# Patient Record
Sex: Male | Born: 1985 | Race: Black or African American | Hispanic: No | Marital: Single | Smoking: Never smoker
Health system: Southern US, Community
[De-identification: ages and names within clinical notes are randomized; demographics above are authoritative.]

---

## 2019-08-26 ENCOUNTER — Emergency Department
Admission: EM | Admit: 2019-08-26 | Discharge: 2019-08-26 | Disposition: A | Discharge status: Home / Self Care | Payer: BC Managed Care – PPO | Attending: Emergency Medicine | Admitting: Emergency Medicine

## 2019-08-26 ENCOUNTER — Emergency Department: Payer: BC Managed Care – PPO

## 2019-08-26 ENCOUNTER — Other Ambulatory Visit: Payer: Self-pay

## 2019-08-26 ENCOUNTER — Encounter: Payer: Self-pay | Admitting: Emergency Medicine

## 2019-08-26 DIAGNOSIS — R05 Cough: Secondary | ICD-10-CM | POA: Diagnosis present

## 2019-08-26 DIAGNOSIS — U071 COVID-19: Secondary | ICD-10-CM | POA: Insufficient documentation

## 2019-08-26 LAB — CBC WITH DIFFERENTIAL/PLATELET
Abs Immature Granulocytes: 0.01 10*3/uL (ref 0.00–0.07)
Basophils Absolute: 0 10*3/uL (ref 0.0–0.1)
Basophils Relative: 0 %
Eosinophils Absolute: 0 10*3/uL (ref 0.0–0.5)
Eosinophils Relative: 0 %
HCT: 41.6 % (ref 39.0–52.0)
Hemoglobin: 13.9 g/dL (ref 13.0–17.0)
Immature Granulocytes: 0 %
Lymphocytes Relative: 23 %
Lymphs Abs: 0.7 10*3/uL (ref 0.7–4.0)
MCH: 28.5 pg (ref 26.0–34.0)
MCHC: 33.4 g/dL (ref 30.0–36.0)
MCV: 85.4 fL (ref 80.0–100.0)
Monocytes Absolute: 0.5 10*3/uL (ref 0.1–1.0)
Monocytes Relative: 17 %
Neutro Abs: 1.7 10*3/uL (ref 1.7–7.7)
Neutrophils Relative %: 60 %
Platelets: 200 10*3/uL (ref 150–400)
RBC: 4.87 MIL/uL (ref 4.22–5.81)
RDW: 11.9 % (ref 11.5–15.5)
Smear Review: NORMAL
WBC: 2.9 10*3/uL — ABNORMAL LOW (ref 4.0–10.5)
nRBC: 0 % (ref 0.0–0.2)

## 2019-08-26 LAB — COMPREHENSIVE METABOLIC PANEL
ALT: 31 U/L (ref 0–44)
AST: 28 U/L (ref 15–41)
Albumin: 3.8 g/dL (ref 3.5–5.0)
Alkaline Phosphatase: 44 U/L (ref 38–126)
Anion gap: 10 (ref 5–15)
BUN: 10 mg/dL (ref 6–20)
CO2: 25 mmol/L (ref 22–32)
Calcium: 8.8 mg/dL — ABNORMAL LOW (ref 8.9–10.3)
Chloride: 99 mmol/L (ref 98–111)
Creatinine, Ser: 1.03 mg/dL (ref 0.61–1.24)
GFR calc Af Amer: >60 mL/min (ref >60)
GFR calc non Af Amer: >60 mL/min (ref >60)
Glucose, Bld: 96 mg/dL (ref 70–99)
Potassium: 3.9 mmol/L (ref 3.5–5.1)
Sodium: 134 mmol/L — ABNORMAL LOW (ref 135–145)
Total Bilirubin: 0.7 mg/dL (ref 0.3–1.2)
Total Protein: 7.3 g/dL (ref 6.5–8.1)

## 2019-08-26 LAB — FIBRIN DERIVATIVES D-DIMER (ARMC ONLY): Fibrin derivatives D-dimer (ARMC): 566.11 ng/mL (FEU) — ABNORMAL HIGH (ref 0.00–499.00)

## 2019-08-26 LAB — TROPONIN I (HIGH SENSITIVITY): Troponin I (High Sensitivity): 5 ng/L (ref <18)

## 2019-08-26 MED ORDER — AZITHROMYCIN 500 MG PO TABS
500.0000 mg | ORAL_TABLET | Freq: Once | ORAL | Status: AC
  Administered 2019-08-26: 500 mg via ORAL
  Filled 2019-08-26: qty 1

## 2019-08-26 MED ORDER — DEXAMETHASONE SODIUM PHOSPHATE 10 MG/ML IJ SOLN
8.0000 mg | Freq: Once | INTRAMUSCULAR | Status: AC
  Administered 2019-08-26: 8 mg via INTRAVENOUS

## 2019-08-26 MED ORDER — IOHEXOL 350 MG/ML SOLN
75.0000 mL | Freq: Once | INTRAVENOUS | Status: AC | PRN
  Administered 2019-08-26: 75 mL via INTRAVENOUS
  Filled 2019-08-26: qty 75

## 2019-08-26 MED ORDER — AZITHROMYCIN 250 MG PO TABS: ORAL_TABLET | ORAL | 0 refills | Status: AC

## 2019-08-26 MED ORDER — BENZONATATE 100 MG PO CAPS: 100.0000 mg | ORAL_CAPSULE | Freq: Three times a day (TID) | ORAL | 0 refills | Status: AC | PRN

## 2019-08-26 MED ORDER — ACETAMINOPHEN 500 MG PO TABS
1000.0000 mg | ORAL_TABLET | Freq: Once | ORAL | Status: AC
  Administered 2019-08-26: 1000 mg via ORAL
  Filled 2019-08-26: qty 2

## 2019-08-26 MED ORDER — ALBUTEROL SULFATE HFA 108 (90 BASE) MCG/ACT IN AERS
2.0000 | INHALATION_SPRAY | Freq: Once | RESPIRATORY_TRACT | Status: AC
  Administered 2019-08-26: 2 via RESPIRATORY_TRACT
  Filled 2019-08-26: qty 6.7

## 2019-08-26 MED ORDER — IBUPROFEN 600 MG PO TABS
600.0000 mg | ORAL_TABLET | Freq: Once | ORAL | Status: AC
  Administered 2019-08-26: 600 mg via ORAL
  Filled 2019-08-26: qty 1

## 2019-08-26 MED ORDER — DEXAMETHASONE SODIUM PHOSPHATE 10 MG/ML IJ SOLN
INTRAMUSCULAR | Status: AC
  Filled 2019-08-26: qty 1

## 2019-08-26 NOTE — ED Triage Notes (Signed)
Pt reports tested positive for COVID on Friday at [facility] and is coughing hard and feels sob. No obvious distress noted, pt talking in complete sentences.

## 2019-08-26 NOTE — Discharge Instructions (Addendum)
There was no blood clot on your CT scan.  Your lab work was overall reassuring.  Please begin antibiotics tomorrow.  You can use albuterol inhaler every 4-6 hours as needed for shortness of breath.  Please continue alternating Tylenol and Motrin for fever.  Please return to the emergency department for any worsening of symptoms.

## 2019-08-26 NOTE — ED Notes (Signed)
Pt ambulated; states he feels "a little dizzy" walking, but not much different than when he is at rest.

## 2019-08-26 NOTE — ED Provider Notes (Signed)
[facility] Emergency Department Provider Note  ____________________________________________  Time seen: Approximately 11:11 AM  I have reviewed the triage vital signs and the nursing notes.   HISTORY  Chief Complaint Cough and Shortness of Breath    HPI Leonard Vance is a 34 y.o. male that is Covid positive that presents to the emergency department for evaluation of fever, weakness, dizziness, nonproductive cough, shortness of breath for 6 days.  He was coughing so hard that he did cough up a streak of blood.  Patient tested positive for Cov[facility] on Friday at [facility]  No sick contacts.  Patient quit smoking a couple of months ago. He has a history of bronchitis.  No vomiting, abdominal pain, diarrhea.   History reviewed. No pertinent past medical history.  There are no problems to display for this patient.   History reviewed. No pertinent surgical history.  Prior to Admission medications   Medication Sig Start Date End Date Taking? Authorizing Provider  azithromycin (ZITHROMAX Z-PAK) 250 MG tablet Take 2 tablets (500 mg) on  Day 1,  followed by 1 tablet (250 mg) once daily on Days 2 through 5. 08/26/19   Enid Derry, PA-C    Allergies Patient has no allergy information on record.  No family history on file.  Social History Social History   Tobacco Use  . Smoking status: Not on file  Substance Use Topics  . Alcohol use: Not on file  . Drug use: Not on file     Review of Systems  Constitutional: Positive for fever. Eyes: No visual changes. No discharge. ENT: Positive for congestion and rhinorrhea. Cardiovascular: No chest pain. Respiratory: Positive for cough.  Positive for SOB. Gastrointestinal: No abdominal pain.  No nausea, no vomiting.  No diarrhea.  No constipation. Musculoskeletal: Positive for body aches. Skin: Negative for rash, abrasions, lacerations, ecchymosis. Neurological: Negative for  headaches.   ____________________________________________   PHYSICAL EXAM:  VITAL SIGNS: ED Triage Vitals  Enc Vitals Group     BP 08/26/19 0924 (!) 141/83     Pulse Rate 08/26/19 0924 (!) 107     Resp 08/26/19 0924 20     Temp 08/26/19 0924 (!) 101.3 F (38.5 C)     Temp Source 08/26/19 0924 Oral     SpO2 08/26/19 0924 98 %     Weight 08/26/19 0922 240 lb (108.9 kg)     Height 08/26/19 0922 6' (1.829 m)     Head Circumference --      Peak Flow --      Pain Score --      Pain Loc --      Pain Edu? --      Excl. in GC? --      Constitutional: Alert and oriented. Well appearing and in no acute distress. Eyes: Conjunctivae are normal. PERRL. EOMI. No discharge. Head: Atraumatic. ENT: No frontal and maxillary sinus tenderness.      Ears: Tympanic membranes pearly gray with good landmarks. No discharge.      Nose: Mild congestion/rhinnorhea.      Mouth/Throat: Mucous membranes are moist. Oropharynx non-erythematous. Tonsils not enlarged. No exudates. Uvula midline. Neck: No stridor.   Hematological/Lymphatic/Immunilogical: No cervical lymphadenopathy. Cardiovascular: Normal rate, regular rhythm.  Good peripheral circulation. Respiratory: Normal respiratory effort without tachypnea or retractions. Lungs CTAB. Good air entry to the bases with no decreased or absent breath sounds. Gastrointestinal: Bowel sounds 4 quadrants. Soft and nontender to palpation. No guarding or rigidity. No palpable masses.  No distention. Musculoskeletal: Full range of motion to all extremities. No gross deformities appreciated. Neurologic:  Normal speech and language. No gross focal neurologic deficits are appreciated.  Skin:  Skin is warm, dry and intact. No rash noted. Psychiatric: Mood and affect are normal. Speech and behavior are normal. Patient exhibits appropriate insight and judgement.   ____________________________________________   LABS (all labs ordered are listed, but only abnormal  results are displayed)  Labs Reviewed  CBC WITH DIFFERENTIAL/PLATELET - Abnormal; Notable for the following components:      Result Value   WBC 2.9 (*)    All other components within normal limits  COMPREHENSIVE METABOLIC PANEL - Abnormal; Notable for the following components:   Sodium 134 (*)    Calcium 8.8 (*)    All other components within normal limits  FIBRIN DERIVATIVES D-DIMER ([facility] ONLY) - Abnormal; Notable for the following components:   Fibrin derivatives D-dimer ([facility]) 566.11 (*)    All other components within normal limits  TROPONIN I (HIGH SENSITIVITY)   ____________________________________________  EKG  SR ____________________________________________  RADIOLOGY Lexine Baton, personally viewed and evaluated these images (plain radiographs) as part of my medical decision making, as well as reviewing the written report by the radiologist.  DG Chest 1 View  Result Date: 08/26/2019 CLINICAL DATA:  COVID positive.  Coughing and short of breath. EXAM: CHEST  1 VIEW COMPARISON:  None. FINDINGS: 0959 hours. Low lung volumes. Cardiopericardial silhouette is at upper limits of normal for size. Vascular crowding/congestion. Subtle patchy opacity noted left base. The visualized bony structures of the thorax are intact. IMPRESSION: Low volume film crowds pulmonary vasculature and accentuates the cardiopericardial silhouette. Probably some patchy airspace disease at the left base. Recommend dedicated PA and lateral upright films to better evaluate as clinically warranted. Electronically Signed   By: Kennith Center M.D.   On: 08/26/2019 10:09   CT Angio Chest PE W and/or Wo Contrast  Result Date: 08/26/2019 CLINICAL DATA:  Shortness of breath.  COVID-19 positive. EXAM: CT ANGIOGRAPHY CHEST WITH CONTRAST TECHNIQUE: Multidetector CT imaging of the chest was performed using the standard protocol during bolus administration of intravenous contrast. Multiplanar CT image reconstructions  and MIPs were obtained to evaluate the vascular anatomy. CONTRAST:  62mL OMNIPAQUE IOHEXOL 350 MG/ML SOLN COMPARISON:  Chest radiograph August 26, 2019 FINDINGS: Cardiovascular: There is no demonstrable pulmonary embolus. There is no thoracic aortic aneurysm or dissection. Visualized great vessels appear normal. Right innominate and left common carotid arteries arise as a common trunk, an anatomic variant. There is no evident pericardial effusion or pericardial thickening. Mediastinum/Nodes: Visualized thyroid appears normal. There are scattered subcentimeter axillary lymph nodes. There is a lymph node at the aortopulmonary window level measuring 1.5 x 1.1 cm. There are subcarinal lymph nodes, largest measuring 1.1 x 1.0 cm. There is a right hilar lymph node measuring 1.3 x 1.3 cm. There is a left hilar lymph node 1.1 x 1.1 cm. No esophageal lesions are evident. Lungs/Pleura: There is extensive ground-glass type opacity scattered throughout the lungs bilaterally with involvement of nearly all lobes in segments to varying degrees with ground-glass type appearance consistent with widespread multifocal atypical organism pneumonia. No frank consolidation evident. No pleural effusions. Upper Abdomen: Visualized upper abdominal structures appear unremarkable. Musculoskeletal: No blastic or lytic bone lesions. There are no evident chest wall lesions. Review of the MIP images confirms the above findings. IMPRESSION: 1. No demonstrable pulmonary embolus. No thoracic aortic aneurysm or dissection. 2. Widespread ground-glass type opacities throughout  the lungs involving most lobes in segments. Suspect widespread multifocal atypical organism pneumonia. 3. Several mildly enlarged lymph nodes which may well be of reactive etiology given the extensive pneumonitis in the lungs. Electronically Signed   By: Lowella Grip III M.D.   On: 08/26/2019 15:28     ____________________________________________    PROCEDURES  Procedure(s) performed:    Procedures    Medications  acetaminophen (TYLENOL) tablet 1,000 mg (1,000 mg Oral Given 08/26/19 0953)  albuterol (VENTOLIN HFA) 108 (90 Base) MCG/ACT inhaler 2 puff (2 puffs Inhalation Given 08/26/19 1157)  azithromycin (ZITHROMAX) tablet 500 mg (500 mg Oral Given 08/26/19 1139)  dexamethasone (DECADRON) injection 8 mg (8 mg Intravenous Given 08/26/19 1158)  ibuprofen (ADVIL) tablet 600 mg (600 mg Oral Given 08/26/19 1454)  iohexol (OMNIPAQUE) 350 MG/ML injection 75 mL (75 mLs Intravenous Contrast Given 08/26/19 1512)     ____________________________________________   INITIAL IMPRESSION / ASSESSMENT AND PLAN / ED COURSE  Pertinent labs & imaging results that were available during my care of the patient were reviewed by me and considered in my medical decision making (see chart for details).  Review of the [place] CSRS was performed in accordance of the [place] prior to dispensing any controlled drugs.   Patient's diagnosis is consistent with covid 19. Patient is Covid positive.   Vital signs and exam are reassuring.  Patient was febrile and tachycardic on arrival to the emergency department.  Temperature 98.3 and HR 90 following antipyretics.  Probable infiltrate on chest x-ray.  No PE on CT scan.  CT scan consistent with COVID-19 showing widespread groundglass opacities. SR on EKG.  Lab work remarkable for WBC 2.9, consistent with viral infection.  CMP remarkable for sodium 134 and calcium 8.8.  Troponin within normal limits.  No oxygen desaturation with ambulation.  Patient was given IM Decadron and albuterol inhaler for shortness of breath and inflammation.  He will be started on azithromycin. Patient appears well and is staying well hydrated. Patient should alternate tylenol and ibuprofen for fever. Patient feels comfortable going home. Patient will be discharged home with prescriptions for  azithromycin. Patient is to follow up with primary care as needed or otherwise directed. Patient is given ED precautions to return to the ED for any worsening or new symptoms.  Leonard Vance was evaluated in Emergency Department on 08/26/2019 for the symptoms described in the history of present illness. He was evaluated in the context of the global COVID-19 pandemic, which necessitated consideration that the patient might be at risk for infection with the SARS-CoV-2 virus that causes COVID-19. Institutional protocols and algorithms that pertain to the evaluation of patients at risk for COVID-19 are in a state of rapid change based on information released by regulatory bodies including the CDC and federal and state organizations. These policies and algorithms were followed during the patient's care in the ED.   ____________________________________________  FINAL CLINICAL IMPRESSION(S) / ED DIAGNOSES  Final diagnoses:  COVID-19      NEW MEDICATIONS STARTED DURING THIS VISIT:  ED Discharge Orders         Ordered    azithromycin (ZITHROMAX Z-PAK) 250 MG tablet     08/26/19 1551              This chart was dictated using voice recognition software/Dragon. Despite best efforts to proofread, errors can occur which can change the meaning. Any change was purely unintentional.    Laban Emperor, PA-C 08/26/19 1626    Blake Divine, MD  08/31/19 1621  

## 2019-08-26 NOTE — ED Notes (Signed)
See triage note  Presents with fever and cough   States he was tested pos for COVID last Friday  States he conts to have cough and body aches

## 2019-08-30 ENCOUNTER — Emergency Department: Payer: BC Managed Care – PPO

## 2019-08-30 ENCOUNTER — Other Ambulatory Visit: Payer: Self-pay

## 2019-08-30 ENCOUNTER — Emergency Department
Admission: EM | Admit: 2019-08-30 | Discharge: 2019-08-30 | Disposition: A | Discharge status: Home / Self Care | Payer: BC Managed Care – PPO | Attending: Emergency Medicine | Admitting: Emergency Medicine

## 2019-08-30 ENCOUNTER — Encounter: Payer: Self-pay | Admitting: Emergency Medicine

## 2019-08-30 DIAGNOSIS — U071 COVID-19: Secondary | ICD-10-CM | POA: Diagnosis not present

## 2019-08-30 DIAGNOSIS — R0602 Shortness of breath: Secondary | ICD-10-CM | POA: Diagnosis present

## 2019-08-30 DIAGNOSIS — J1282 Pneumonia due to coronavirus disease 2019: Secondary | ICD-10-CM | POA: Insufficient documentation

## 2019-08-30 LAB — CBC
HCT: 45.4 % (ref 39.0–52.0)
Hemoglobin: 15.4 g/dL (ref 13.0–17.0)
MCH: 28.9 pg (ref 26.0–34.0)
MCHC: 33.9 g/dL (ref 30.0–36.0)
MCV: 85.3 fL (ref 80.0–100.0)
Platelets: 334 10*3/uL (ref 150–400)
RBC: 5.32 MIL/uL (ref 4.22–5.81)
RDW: 11.8 % (ref 11.5–15.5)
WBC: 4.7 10*3/uL (ref 4.0–10.5)
nRBC: 0 % (ref 0.0–0.2)

## 2019-08-30 LAB — TROPONIN I (HIGH SENSITIVITY)
Troponin I (High Sensitivity): 3 ng/L (ref <18)
Troponin I (High Sensitivity): 4 ng/L (ref <18)

## 2019-08-30 LAB — BASIC METABOLIC PANEL
Anion gap: 12 (ref 5–15)
BUN: 11 mg/dL (ref 6–20)
CO2: 27 mmol/L (ref 22–32)
Calcium: 9.1 mg/dL (ref 8.9–10.3)
Chloride: 99 mmol/L (ref 98–111)
Creatinine, Ser: 0.98 mg/dL (ref 0.61–1.24)
GFR calc Af Amer: >60 mL/min (ref >60)
GFR calc non Af Amer: >60 mL/min (ref >60)
Glucose, Bld: 109 mg/dL — ABNORMAL HIGH (ref 70–99)
Potassium: 4 mmol/L (ref 3.5–5.1)
Sodium: 138 mmol/L (ref 135–145)

## 2019-08-30 LAB — FIBRIN DERIVATIVES D-DIMER (ARMC ONLY): Fibrin derivatives D-dimer (ARMC): 852.86 ng/mL (FEU) — ABNORMAL HIGH (ref 0.00–499.00)

## 2019-08-30 MED ORDER — DEXAMETHASONE SODIUM PHOSPHATE 10 MG/ML IJ SOLN
10.0000 mg | Freq: Once | INTRAMUSCULAR | Status: AC
  Administered 2019-08-30: 10 mg via INTRAMUSCULAR
  Filled 2019-08-30: qty 1

## 2019-08-30 MED ORDER — DEXAMETHASONE 0.75 MG PO TABS: 0.7500 mg | ORAL_TABLET | Freq: Two times a day (BID) | ORAL | 0 refills | Status: AC

## 2019-08-30 MED ORDER — SODIUM CHLORIDE 0.9% FLUSH: 3.0000 mL | Freq: Once | INTRAVENOUS | Status: DC

## 2019-08-30 NOTE — ED Notes (Signed)
Pt ambulated around room, pt O2 sat maintained 94-97% on RA

## 2019-08-30 NOTE — ED Notes (Signed)
See triage note  Presents with some SOB  Was seen last week for same sx's   States he is still running low grade fever and having cough  Ambulates well  Cont's to speak in full sentences

## 2019-08-30 NOTE — ED Triage Notes (Signed)
Pt presents to ED via POV, states tested positive on 3/12, seen on 3/18 for same. Pt states continues to feel SOB, continues to have cough. Pt ambulatory without difficulty and able to speak in full and complete sentences, RA sats 98% after ambulating to triage room.

## 2019-08-30 NOTE — Discharge Instructions (Signed)
Return to the emergency department if worsening.  Use medication as prescribed. You should remain out of work for an additional 5 days.

## 2019-08-30 NOTE — ED Provider Notes (Signed)
[facility] Emergency Department Provider Note  ____________________________________________   First MD Initiated Contact with Patient 08/30/19 1253     (approximate)  I have reviewed the triage vital signs and the nursing notes.   HISTORY  Chief Complaint Covid+ and Shortness of Breath    HPI Leonard Vance is a 34 y.o. male presents emergency department complaint of shortness of breath due to Covid.  Patient states he was diagnosed with Covid on 08/20/2019.  Was seen here last week because he coughed up some blood and was told to return if he was not improving.  Patient states he still coughing and feels short of breath.  He supposed to go back to work tomorrow.  He denies any other symptoms at this time.   He did take his Z-Pak and Tessalon Perles along with his inhaler.   History reviewed. No pertinent past medical history.  There are no problems to display for this patient.   History reviewed. No pertinent surgical history.  Prior to Admission medications   Medication Sig Start Date End Date Taking? Authorizing Provider  azithromycin (ZITHROMAX Z-PAK) 250 MG tablet Take 2 tablets (500 mg) on  Day 1,  followed by 1 tablet (250 mg) once daily on Days 2 through 5. 08/26/19   Enid Derry, PA-C  benzonatate (TESSALON PERLES) 100 MG capsule Take 1 capsule (100 mg total) by mouth 3 (three) times daily as needed. 08/26/19 08/25/20  Enid Derry, PA-C  dexamethasone (DECADRON) 0.75 MG tablet Take 1 tablet (0.75 mg total) by mouth 2 (two) times daily. 08/30/19   Faythe Ghee, PA-C    Allergies Patient has no known allergies.  History reviewed. No pertinent family history.  Social History Social History   Tobacco Use  . Smoking status: Not on file  Substance Use Topics  . Alcohol use: Not on file  . Drug use: Not on file    Review of Systems  Constitutional: No fever/chills Eyes: No visual changes. ENT: No sore throat. Respiratory:  Positive cough and shortness of breath Cardiovascular: Denies chest pain Gastrointestinal: Denies abdominal pain Genitourinary: Negative for dysuria. Musculoskeletal: Negative for back pain. Skin: Negative for rash. Psychiatric: no mood changes,     ____________________________________________   PHYSICAL EXAM:  VITAL SIGNS: ED Triage Vitals  Enc Vitals Group     BP 08/30/19 1118 (!) 112/98     Pulse Rate 08/30/19 1118 (!) 107     Resp 08/30/19 1118 (!) 22     Temp 08/30/19 1118 99.1 F (37.3 C)     Temp Source 08/30/19 1118 Oral     SpO2 08/30/19 1118 97 %     Weight 08/30/19 1126 240 lb (108.9 kg)     Height 08/30/19 1126 6' (1.829 m)     Head Circumference --      Peak Flow --      Pain Score 08/30/19 1119 0     Pain Loc --      Pain Edu? --      Excl. in GC? --     Constitutional: Alert and oriented. Well appearing and in no acute distress. Eyes: Conjunctivae are normal.  Head: Atraumatic. Nose: No congestion/rhinnorhea. Mouth/Throat: Mucous membranes are moist.   Neck:  supple no lymphadenopathy noted Cardiovascular: Normal rate, regular rhythm. Heart sounds are normal Respiratory: Normal respiratory effort.  No retractions, lungs c t a, cough is dry GU: deferred Musculoskeletal: FROM all extremities, warm and well perfused Neurologic:  Normal speech  and language.  Skin:  Skin is warm, dry and intact. No rash noted. Psychiatric: Mood and affect are normal. Speech and behavior are normal.  ____________________________________________   LABS (all labs ordered are listed, but only abnormal results are displayed)  Labs Reviewed  BASIC METABOLIC PANEL - Abnormal; Notable for the following components:      Result Value   Glucose, Bld 109 (*)    All other components within normal limits  FIBRIN DERIVATIVES D-DIMER ([facility] ONLY) - Abnormal; Notable for the following components:   Fibrin derivatives D-dimer ([facility]) 852.86 (*)    All other components within normal  limits  CBC  TROPONIN I (HIGH SENSITIVITY)  TROPONIN I (HIGH SENSITIVITY)   ____________________________________________   ____________________________________________  RADIOLOGY  Chest x-ray remains stable from last visit  ____________________________________________   PROCEDURES  Procedure(s) performed: EKG shows tachycardia at 110 bpm  Procedures    ____________________________________________   INITIAL IMPRESSION / ASSESSMENT AND PLAN / ED COURSE  Pertinent labs & imaging results that were available during my care of the patient were reviewed by me and considered in my medical decision making (see chart for details).   Patient is 34 year old Covid positive male presents emergency department complaining of cough and shortness of breath.  States he is not improved since his visit last week.  See HPI  Physical exam patient appears well.  He is tachycardic but his O2 saturation is 97% on room air.  Remainder of exam is unremarkable  DDx: Covid, Covid pneumonia, PE  CBC is normal, basic metabolic panel is normal, troponin is normal, D-dimer is pending  Chest x-ray appears stable from last visit 1 week ago.  Patient does appear very well and stable.  Explained to him that we will await the D-dimer results to see if he needs a CT.  If not he will be able to be discharged with prescription for dexamethasone.  He does not want to return to work tomorrow which I agree if he still symptomatic he could remain out.  D-dimer is elevated at 856.  However I do feel it is not high enough to indicate PE on a Covid patient.  He will be placed on dexamethasone.  He was written out of work for an additional week.  Strict instructions to return if worsening.  I did discuss treatment plan with Dr. Corky Downs.  He agrees with treatment plan.  Patient was discharged stable condition.      Leonard Vance was evaluated in Emergency Department on 08/30/2019 for the symptoms described in the  history of present illness. He was evaluated in the context of the global COVID-19 pandemic, which necessitated consideration that the patient might be at risk for infection with the SARS-CoV-2 virus that causes COVID-19. Institutional protocols and algorithms that pertain to the evaluation of patients at risk for COVID-19 are in a state of rapid change based on information released by regulatory bodies including the CDC and federal and state organizations. These policies and algorithms were followed during the patient's care in the ED.   As part of my medical decision making, I reviewed the following data within the [place] notes reviewed and incorporated, Labs reviewed , EKG interpreted NSR and tachycardia, Old chart reviewed, Radiograph reviewed , Notes from prior ED visits and [place] Controlled Substance Database  ____________________________________________   FINAL CLINICAL IMPRESSION(S) / ED DIAGNOSES  Final diagnoses:  Pneumonia due to COVID-19 virus      NEW MEDICATIONS STARTED DURING THIS  VISIT:  New Prescriptions   DEXAMETHASONE (DECADRON) 0.75 MG TABLET    Take 1 tablet (0.75 mg total) by mouth 2 (two) times daily.     Note:  This document was prepared using Dragon voice recognition software and may include unintentional dictation errors.    Faythe Ghee, PA-C 08/30/19 1422    Jene Every, MD 08/30/19 [phone]

## 2020-02-08 ENCOUNTER — Other Ambulatory Visit: Payer: Self-pay

## 2020-02-08 ENCOUNTER — Emergency Department
Admission: EM | Admit: 2020-02-08 | Discharge: 2020-02-08 | Disposition: A | Discharge status: Home / Self Care | Payer: BC Managed Care – PPO | Attending: Emergency Medicine | Admitting: Emergency Medicine

## 2020-02-08 DIAGNOSIS — K047 Periapical abscess without sinus: Secondary | ICD-10-CM | POA: Diagnosis not present

## 2020-02-08 DIAGNOSIS — Z79899 Other long term (current) drug therapy: Secondary | ICD-10-CM | POA: Diagnosis not present

## 2020-02-08 MED ORDER — TRAMADOL HCL 50 MG PO TABS: 50.0000 mg | ORAL_TABLET | Freq: Four times a day (QID) | ORAL | 0 refills | Status: AC | PRN

## 2020-02-08 MED ORDER — AMOXICILLIN 875 MG PO TABS: 875.0000 mg | ORAL_TABLET | Freq: Two times a day (BID) | ORAL | 0 refills | Status: DC

## 2020-02-08 MED ORDER — AMOXICILLIN-POT CLAVULANATE 875-125 MG PO TABS
1.0000 | ORAL_TABLET | Freq: Once | ORAL | Status: AC
  Administered 2020-02-08: 1 via ORAL
  Filled 2020-02-08: qty 1

## 2020-02-08 NOTE — Discharge Instructions (Signed)
[facility]: [phone]  OPTIONS FOR DENTAL FOLLOW UP CARE  [place] [facility] - Local Safety Net Dental Clinics [email or web address]   [place] [facility] [phone])  [facility] [phone])  [place] [phone])  [facility] [phone])  [facility] [phone]) This clinic caters to the indigent population and is on a lottery system. Location: [employer] [facility], [employer], [address] [place] Clinic Hours: Wednesdays from 6pm - 9pm, patients seen by a lottery system. For dates, call or go to [email or web address] Services: Cleanings, fillings and simple extractions. Payment Options: DENTAL WORK IS FREE OF CHARGE. Bring proof of income or support. Best way to get seen: Arrive at 5:15 pm - this is a lottery, NOT first come/first serve, so arriving earlier will not increase your chances of being seen.     [facility] [phone] Select option 1 for emergencies   Location: [employer] [facility], [place], [address] [place] Clinic Hours: No walk-ins accepted - call the day before to schedule an appointment. Check in times are 9:30 am and 1:30 pm. Services: Simple extractions, temporary fillings, pulpectomy/pulp debridement, uncomplicated abscess drainage. Payment Options: PAYMENT IS DUE AT THE TIME OF SERVICE.  Fee is usually $100-200, additional surgical procedures (e.g. abscess drainage) may be extra. Cash, checks, Visa/MasterCard accepted.  Can file Medicaid if patient is covered for dental - patient should call case worker to check. No discount for [facility] patients. Best way to get seen: MUST call the day before and get onto the schedule. Can usually be seen the next 1-2 days. No walk-ins accepted.     [facility]  Dental Services [phone]   Location: [facility], [address] [place] Clinic Hours: M, W, Th, F 8am or 1:30pm, Tues 9a or 1:30 - first come/first served. Services: Simple extractions, temporary fillings, uncomplicated abscess drainage.  You do not need to be an [facility] resident. Payment Options: PAYMENT IS DUE AT THE TIME OF SERVICE. Dental insurance, otherwise sliding scale - bring proof of income or support. Depending on income and treatment needed, cost is usually $50-200. Best way to get seen: Arrive early as it is first come/first served.     [facility] [phone]   Location: [address] Clinic Hours: Mon-Thu 8a-5p Services: Most basic dental services including extractions and fillings. Payment Options: PAYMENT IS DUE AT THE TIME OF SERVICE. Sliding scale, up to 50% off - bring proof if income or support. Medicaid with dental option accepted. Best way to get seen: Call to schedule an appointment, can usually be seen within 2 weeks OR they will try to see walk-ins - show up at 8a or 2p (you may have to wait).     [facility] [phone] [place] RESIDENTS ONLY   Location: [facility], [address], [place], [place] [address] Clinic Hours: By appointment only. Monday - Thursday 8am-5pm, Friday 8am-12pm Services: Cleanings, fillings, extractions. Payment Options: PAYMENT IS DUE AT THE TIME OF SERVICE. Cash, Visa or MasterCard. Sliding scale - $30 minimum per service. Best way to get seen: Come in to office, complete packet and make an appointment - need proof of income or support monies for each household member and proof of [facility] residence. Usually takes about a month to get in.     [facility] [phone]  Location: [address], [place] Clinic Hours: Walk-in Urgent Care Dental Services  are offered Monday-Friday mornings only. The numbers of emergencies accepted daily is limited to the number of providers available. Maximum 15 - Mondays, Wednesdays & Thursdays Maximum 10 - Tuesdays & Fridays Services: You do not need to be a [facility] resident to be seen for a dental emergency. Emergencies are defined as pain, swelling, abnormal bleeding, or dental trauma. Walkins will receive x-rays if needed. NOTE: Dental cleaning is not an emergency. Payment Options: PAYMENT IS DUE AT THE TIME OF SERVICE. Minimum co-pay is $40.00 for uninsured patients. Minimum co-pay is $3.00 for Medicaid with dental coverage. Dental Insurance is accepted and must be presented at time of visit. Medicare does not cover dental. Forms of payment: Cash, credit card, checks. Best way to get seen: If not previously registered with the clinic, walk-in dental registration begins at 7:15 am and is on a first come/first serve basis. If previously registered with the clinic, call to make an appointment.     [facility] [place] ONLY   Location: [address], [place], [place] Clinic Hours: Mon-Thu 10a-2p Services: Extractions only! Payment Options: FREE (donations accepted) - bring proof of income or support Best way to get seen: Call and schedule an appointment OR come at 8am on the 1st Monday of every month (except for holidays) when it is first come/first served.     [facility] [phone]   Location: [place], [place] Clinic Hours: Friday mornings Services, Payment Options, Best way to get seen: Call for info

## 2020-02-08 NOTE — ED Triage Notes (Signed)
Pt complains of left upper jaw pain that began while he was brushing his teeth this am. Pt with swelling noted to jaw, denies shob, chest pain.

## 2020-02-08 NOTE — ED Provider Notes (Signed)
[facility] Emergency Department Provider Note  ____________________________________________  Time seen: Approximately 9:27 PM  I have reviewed the triage vital signs and the nursing notes.   HISTORY  Chief Complaint Dental Pain    HPI Leonard Vance is a 34 y.o. male who presents the emergency department for evaluation of left upper dental pain.  Patient states that he has had intermittent dental problems but does not see a dentist.  He states that as he was brushing his tooth today he had a sudden sharp pain to the left upper dentition.  He has had pain, swelling to this area throughout the day.  No fevers or chills.  No difficulty breathing or swallowing.  Patient states that he has moved to the area recently and does not have an established dentist.         No past medical history on file.  There are no problems to display for this patient.   No past surgical history on file.  Prior to Admission medications   Medication Sig Start Date End Date Taking? Authorizing Provider  amoxicillin (AMOXIL) 875 MG tablet Take 1 tablet (875 mg total) by mouth 2 (two) times daily. 02/08/20   Ousmane Seeman, Delorise Royals, PA-C  azithromycin (ZITHROMAX Z-PAK) 250 MG tablet Take 2 tablets (500 mg) on  Day 1,  followed by 1 tablet (250 mg) once daily on Days 2 through 5. 08/26/19   Enid Derry, PA-C  benzonatate (TESSALON PERLES) 100 MG capsule Take 1 capsule (100 mg total) by mouth 3 (three) times daily as needed. 08/26/19 08/25/20  Enid Derry, PA-C  dexamethasone (DECADRON) 0.75 MG tablet Take 1 tablet (0.75 mg total) by mouth 2 (two) times daily. 08/30/19   Fisher, Roselyn Bering, PA-C  traMADol (ULTRAM) 50 MG tablet Take 1 tablet (50 mg total) by mouth every 6 (six) hours as needed. 02/08/20   Shawna Wearing, Delorise Royals, PA-C    Allergies Patient has no known allergies.  No family history on file.  Social History Social History   Tobacco Use  . Smoking status: Not on file   Substance Use Topics  . Alcohol use: Not on file  . Drug use: Not on file     Review of Systems  Constitutional: No fever/chills Eyes: No visual changes. No discharge ENT: Left upper dental pain/infection Cardiovascular: no chest pain. Respiratory: no cough. No SOB. Gastrointestinal: No abdominal pain.  No nausea, no vomiting.  No diarrhea.  No constipation. Musculoskeletal: Negative for musculoskeletal pain. Skin: Negative for rash, abrasions, lacerations, ecchymosis. Neurological: Negative for headaches, focal weakness or numbness. 10-point ROS otherwise negative.  ____________________________________________   PHYSICAL EXAM:  VITAL SIGNS: ED Triage Vitals  Enc Vitals Group     BP 02/08/20 1944 (!) 148/92     Pulse Rate 02/08/20 1944 74     Resp 02/08/20 1944 16     Temp 02/08/20 1944 99 F (37.2 C)     Temp Source 02/08/20 1944 Oral     SpO2 02/08/20 1944 96 %     Weight 02/08/20 1945 237 lb (107.5 kg)     Height 02/08/20 1945 6' (1.829 m)     Head Circumference --      Peak Flow --      Pain Score 02/08/20 1945 10     Pain Loc --      Pain Edu? --      Excl. in GC? --      Constitutional: Alert and oriented. Well appearing and  in no acute distress. Eyes: Conjunctivae are normal. PERRL. EOMI. Head: Atraumatic. ENT:      Ears:       Nose: No congestion/rhinnorhea.      Mouth/Throat: Mucous membranes are moist.  Visualization of the mouth reveals overall good dentition.  A few caries and fractured tooth is identified.  No gross erythema or edema of the gumline.  No purulent drainage.  Uvula is midline. Neck: No stridor.    Cardiovascular: Normal rate, regular rhythm. Normal S1 and S2.  Good peripheral circulation. Respiratory: Normal respiratory effort without tachypnea or retractions. Lungs CTAB. Good air entry to the bases with no decreased or absent breath sounds. Musculoskeletal: Full range of motion to all extremities. No gross deformities  appreciated. Neurologic:  Normal speech and language. No gross focal neurologic deficits are appreciated.  Skin:  Skin is warm, dry and intact. No rash noted. Psychiatric: Mood and affect are normal. Speech and behavior are normal. Patient exhibits appropriate insight and judgement.   ____________________________________________   LABS (all labs ordered are listed, but only abnormal results are displayed)  Labs Reviewed - No data to display ____________________________________________  EKG   ____________________________________________  RADIOLOGY   No results found.  ____________________________________________    PROCEDURES  Procedure(s) performed:    Procedures    Medications  amoxicillin-clavulanate (AUGMENTIN) 875-125 MG per tablet 1 tablet (has no administration in time range)     ____________________________________________   INITIAL IMPRESSION / ASSESSMENT AND PLAN / ED COURSE  Pertinent labs & imaging results that were available during my care of the patient were reviewed by me and considered in my medical decision making (see chart for details).  Review of the [place] CSRS was performed in accordance of the [facility] prior to dispensing any controlled drugs.           Patient's diagnosis is consistent with dental infection.  Patient presented to emergency department complaining of left upper dental pain.  Overall exam is reassuring.  No indication for labs or imaging.  Patient be started on amoxicillin and given tramadol for her symptom relief.  Follow-up with dentist..  Patient is given ED precautions to return to the ED for any worsening or new symptoms.     ____________________________________________  FINAL CLINICAL IMPRESSION(S) / ED DIAGNOSES  Final diagnoses:  Dental infection      NEW MEDICATIONS STARTED DURING THIS VISIT:  ED Discharge Orders         Ordered    amoxicillin (AMOXIL) 875 MG tablet  2 times daily        02/08/20 2137     traMADol (ULTRAM) 50 MG tablet  Every 6 hours PRN        02/08/20 2137              This chart was dictated using voice recognition software/Dragon. Despite best efforts to proofread, errors can occur which can change the meaning. Any change was purely unintentional.    Racheal Patches, PA-C 02/08/20 2142    Dionne Bucy, MD 02/08/20 2209

## 2020-10-31 IMAGING — CT CT ANGIO CHEST
2 of 6 series · 18 of 46 positions shown · IV contrast (APPLIED)
Comparison: Chest radiograph August 26, 2019

CLINICAL DATA: Shortness of breath.  IVRYN-P0 positive.

EXAM:
CT ANGIOGRAPHY CHEST WITH CONTRAST
TECHNIQUE: Multidetector CT imaging of the chest was performed using the
standard protocol during bolus administration of intravenous
contrast. Multiplanar CT image reconstructions and MIPs were
obtained to evaluate the vascular anatomy.
CONTRAST:  75mL OMNIPAQUE IOHEXOL 350 MG/ML SOLN

[Series 5: thins · axial · 0.74mm/px · z∈[-384,-132]mm · 16 of 278 slices shown]
[im 13/278  lung]
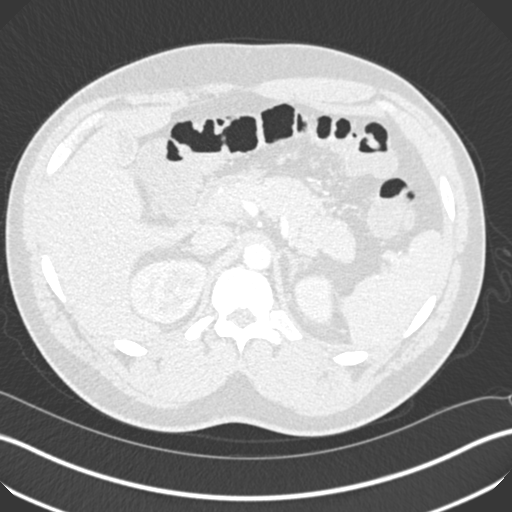
[im 37/278  soft-tissue]
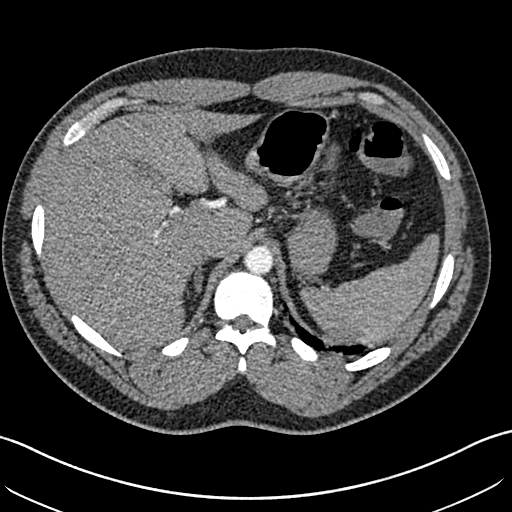
[im 49/278  lung]
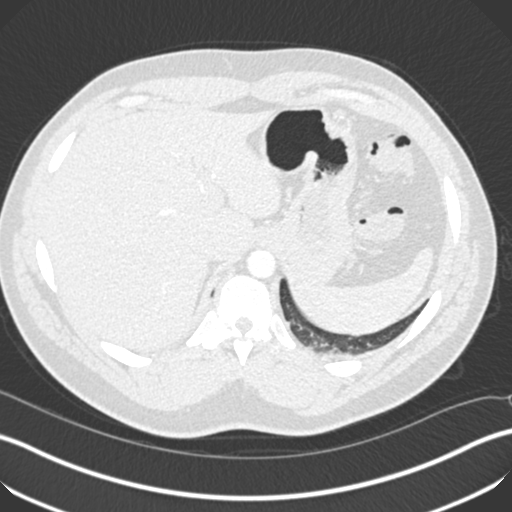
[im 61/278  soft-tissue]
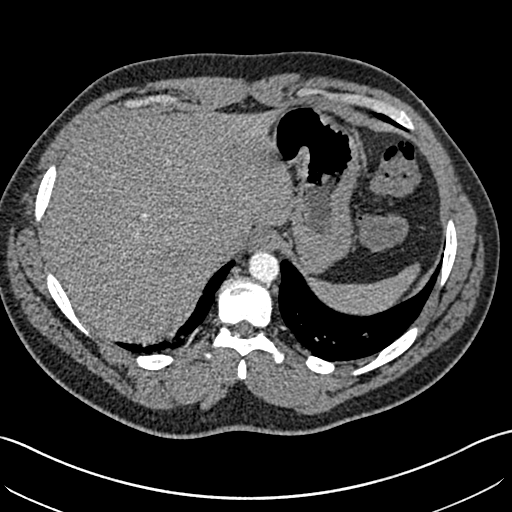
[im 85/278  lung]
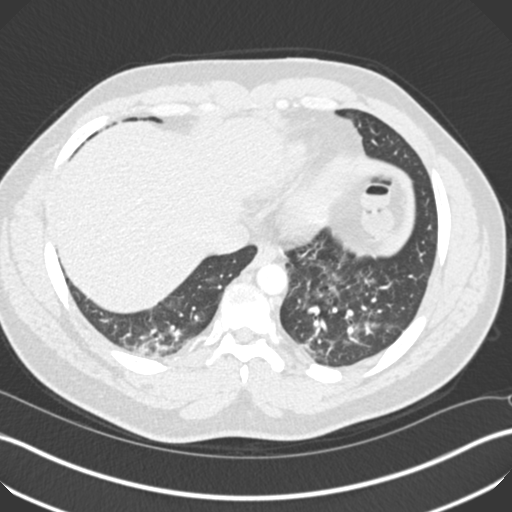
[im 97/278  soft-tissue]
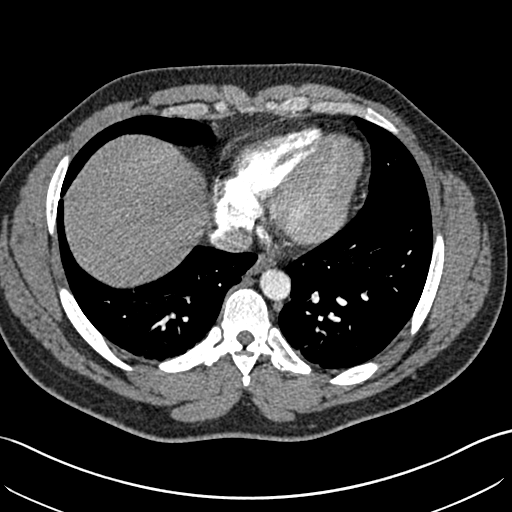
[im 109/278  lung]
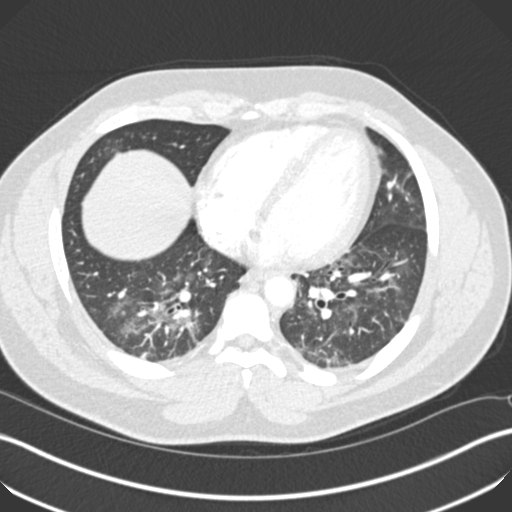
[im 133/278  soft-tissue]
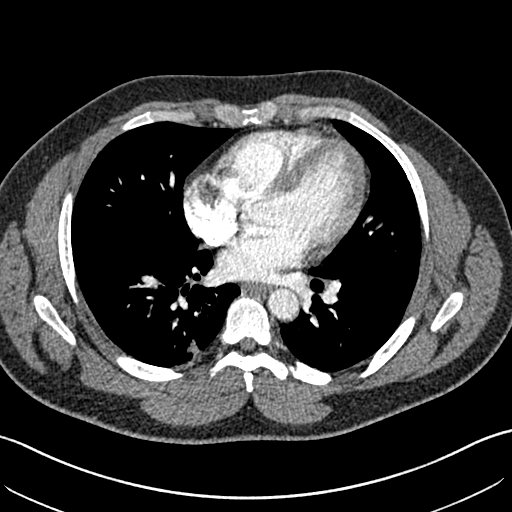
[im 145/278  lung]
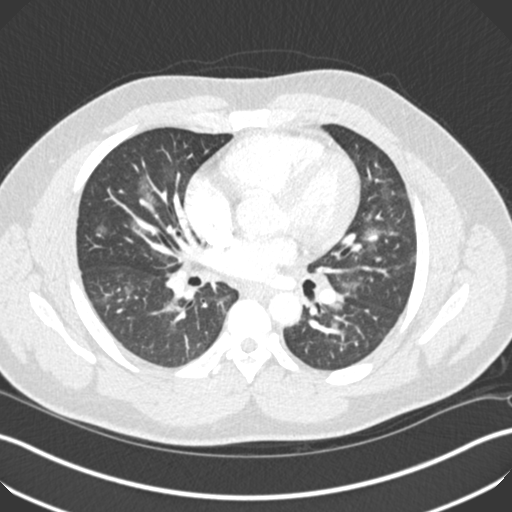
[im 169/278  soft-tissue]
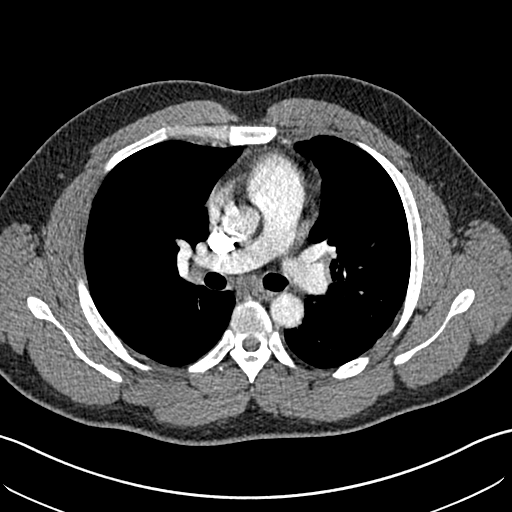
[im 181/278  lung]
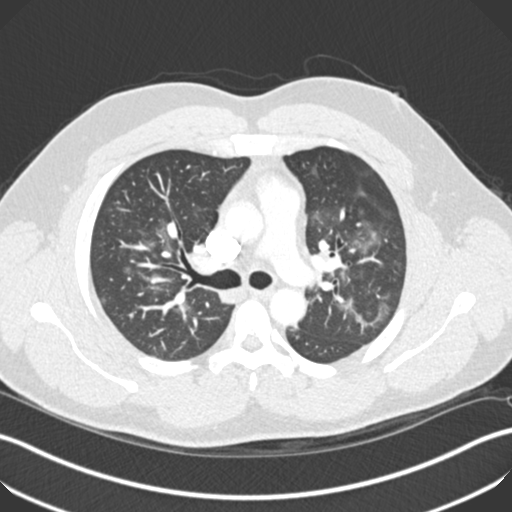
[im 193/278  soft-tissue]
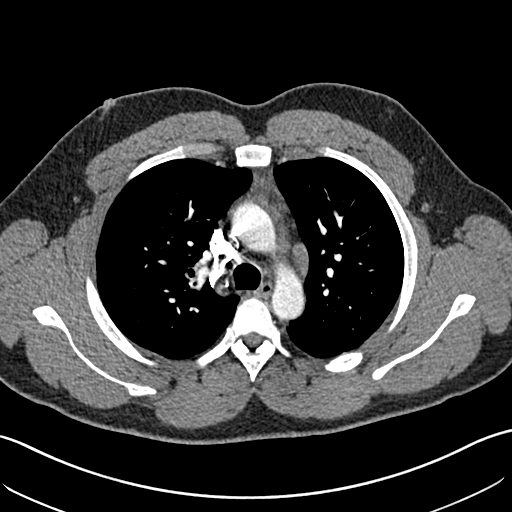
[im 217/278  lung]
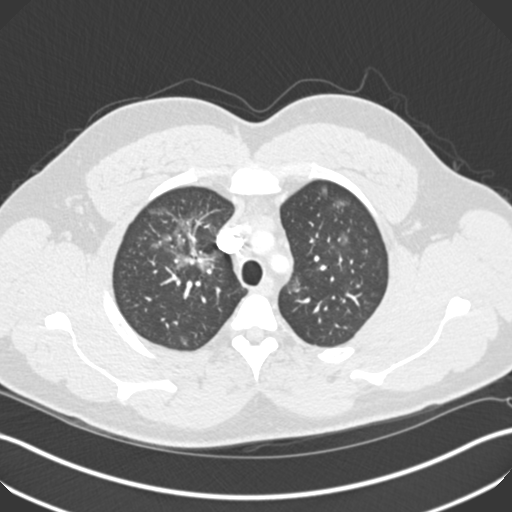
[im 229/278  soft-tissue]
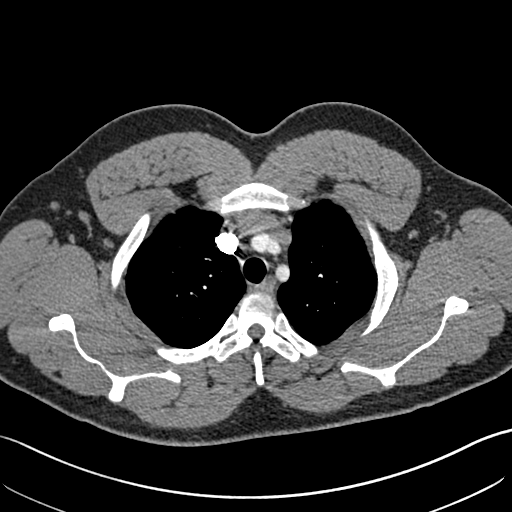
[im 241/278  lung]
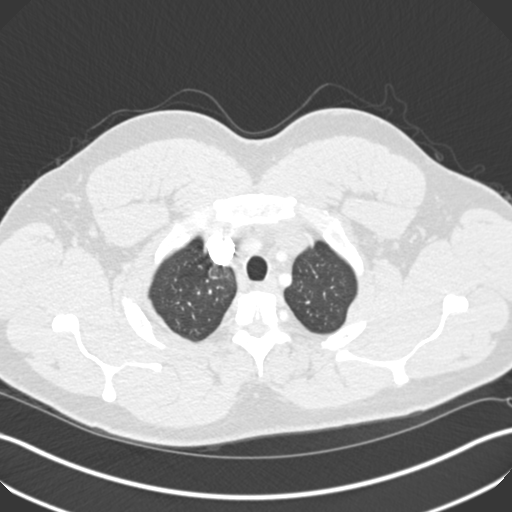
[im 265/278  soft-tissue]
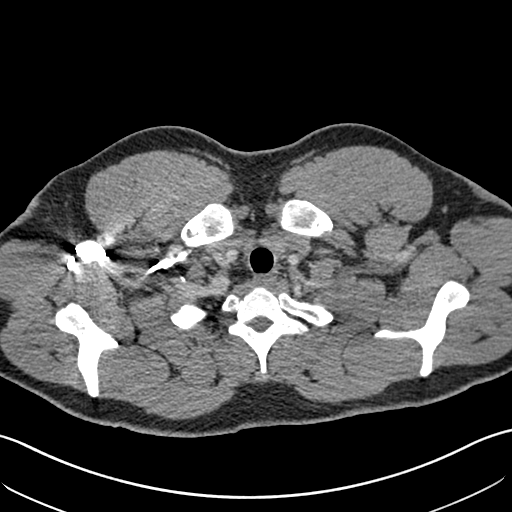

[Series 7: coronal mpr · coronal · 0.54mm/px · 2 of 91 slices shown]
[im 31/91  soft-tissue]
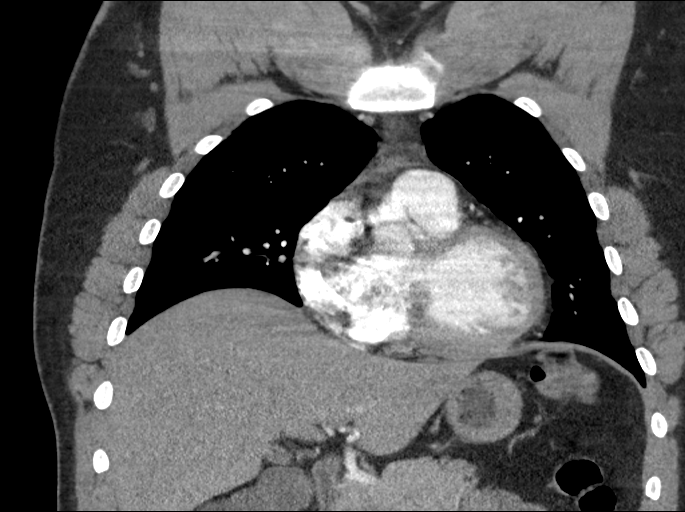
[im 61/91  soft-tissue]
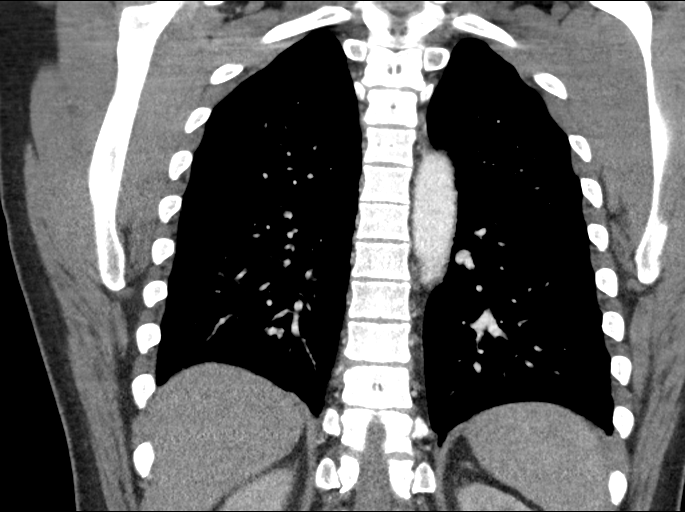

[18 of 46 positions shown; findings below may reference images not displayed]

FINDINGS: Cardiovascular: There is no demonstrable pulmonary embolus. There is
no thoracic aortic aneurysm or dissection. Visualized great vessels
appear normal. Right innominate and left common carotid arteries
arise as a common trunk, an anatomic variant. There is no evident
pericardial effusion or pericardial thickening.

Mediastinum/Nodes: Visualized thyroid appears normal. There are
scattered subcentimeter axillary lymph nodes. There is a lymph node
at the aortopulmonary window level measuring 1.5 x 1.1 cm. There are
subcarinal lymph nodes, largest measuring 1.1 x 1.0 cm. There is a
right hilar lymph node measuring 1.3 x 1.3 cm. There is a left hilar
lymph node 1.1 x 1.1 cm. No esophageal lesions are evident.

Lungs/Pleura: There is extensive ground-glass type opacity scattered
throughout the lungs bilaterally with involvement of nearly all
lobes in segments to varying degrees with ground-glass type
appearance consistent with widespread multifocal atypical organism
pneumonia. No frank consolidation evident. No pleural effusions.

Upper Abdomen: Visualized upper abdominal structures appear
unremarkable.

Musculoskeletal: No blastic or lytic bone lesions. There are no
evident chest wall lesions.

Review of the MIP images confirms the above findings.
IMPRESSION: 1. No demonstrable pulmonary embolus. No thoracic aortic aneurysm or
dissection.

2. Widespread ground-glass type opacities throughout the lungs
involving most lobes in segments. Suspect widespread multifocal
atypical organism pneumonia.

3. Several mildly enlarged lymph nodes which may well be of reactive
etiology given the extensive pneumonitis in the lungs.

## 2021-09-20 ENCOUNTER — Emergency Department
Admission: EM | Admit: 2021-09-20 | Discharge: 2021-09-20 | Disposition: A | Discharge status: Home / Self Care | Payer: BC Managed Care – PPO | Attending: Emergency Medicine | Admitting: Emergency Medicine

## 2021-09-20 ENCOUNTER — Other Ambulatory Visit: Payer: Self-pay

## 2021-09-20 DIAGNOSIS — N342 Other urethritis: Secondary | ICD-10-CM | POA: Diagnosis not present

## 2021-09-20 DIAGNOSIS — Z202 Contact with and (suspected) exposure to infections with a predominantly sexual mode of transmission: Secondary | ICD-10-CM | POA: Diagnosis not present

## 2021-09-20 DIAGNOSIS — A64 Unspecified sexually transmitted disease: Secondary | ICD-10-CM

## 2021-09-20 DIAGNOSIS — R300 Dysuria: Secondary | ICD-10-CM | POA: Diagnosis present

## 2021-09-20 LAB — URINALYSIS, ROUTINE W REFLEX MICROSCOPIC
Bacteria, UA: NONE SEEN
Bilirubin Urine: NEGATIVE
Glucose, UA: NEGATIVE mg/dL
Hgb urine dipstick: NEGATIVE
Ketones, ur: NEGATIVE mg/dL
Nitrite: NEGATIVE
Protein, ur: NEGATIVE mg/dL
Specific Gravity, Urine: 1.026 (ref 1.005–1.030)
WBC, UA: >50 WBC/hpf — ABNORMAL HIGH (ref 0–5)
pH: 6 (ref 5.0–8.0)

## 2021-09-20 LAB — CHLAMYDIA/NGC RT PCR (ARMC ONLY)
Chlamydia Tr: NOT DETECTED
N gonorrhoeae: DETECTED — AB

## 2021-09-20 MED ORDER — CEFTRIAXONE SODIUM 1 G IJ SOLR
500.0000 mg | Freq: Once | INTRAMUSCULAR | Status: AC
  Administered 2021-09-20: 500 mg via INTRAMUSCULAR
  Filled 2021-09-20: qty 10

## 2021-09-20 MED ORDER — DOXYCYCLINE HYCLATE 100 MG PO CAPS: 100.0000 mg | ORAL_CAPSULE | Freq: Two times a day (BID) | ORAL | 0 refills | Status: AC

## 2021-09-20 NOTE — ED Triage Notes (Signed)
Pt comes with c/o pelvic pain and discharge. Pt states this started yesterday.  ?

## 2021-09-20 NOTE — ED Provider Notes (Signed)
? ?[facility] ?Provider Note ? ? ? Event Date/Time  ? First MD Initiated Contact with Patient 09/20/21 1840   ?  (approximate) ? ? ?History  ? ?Chief Complaint ?Dysuria ? ?HPI ? ?ALEXXANDER KURT is a 36 y.o. male with no significant past medical history who presents to the ED complaining of dysuria.  Patient reports that he has had 24-48 hours of burning when he urinates along with yellowish penile discharge.  He states that he is sore in much of his groin area but he has not noticed any rashes or other lesions.  He denies any pain or swelling in his testicles and has not had any fevers.  He is concerned that he was exposed to a sexually transmitted infection by his girlfriend, who is being seen in the ED for similar symptoms. ? ?  ? ? ?Physical Exam  ? ?Triage Vital Signs: ?ED Triage Vitals [09/20/21 1743]  ?Enc Vitals Group  ?   BP (!) 158/109  ?   Pulse Rate 69  ?   Resp 18  ?   Temp 98.8 ?F (37.1 ?C)  ?   Temp src   ?   SpO2 98 %  ?   Weight   ?   Height   ?   Head Circumference   ?   Peak Flow   ?   Pain Score 7  ?   Pain Loc   ?   Pain Edu?   ?   Excl. in GC?   ? ? ?Most recent vital signs: ?Vitals:  ? 09/20/21 1743  ?BP: (!) 158/109  ?Pulse: 69  ?Resp: 18  ?Temp: 98.8 ?F (37.1 ?C)  ?SpO2: 98%  ? ? ?Constitutional: Alert and oriented. ?Eyes: Conjunctivae are normal. ?Head: Atraumatic. ?Nose: No congestion/rhinnorhea. ?Mouth/Throat: Mucous membranes are moist.  ?Cardiovascular: Normal rate, regular rhythm. Grossly normal heart sounds.  2+ radial pulses bilaterally. ?Respiratory: Normal respiratory effort.  No retractions. Lungs CTAB. ?Gastrointestinal: Soft and nontender. No distention. ?Genitourinary: No penile lesions noted.  No testicular edema or tenderness. ?Musculoskeletal: No lower extremity tenderness nor edema.  ?Neurologic:  Normal speech and language. No gross focal neurologic deficits are appreciated. ? ? ? ?ED Results / Procedures / Treatments  ? ?Labs ?(all labs ordered  are listed, but only abnormal results are displayed) ?Labs Reviewed  ?URINALYSIS, ROUTINE W REFLEX MICROSCOPIC - Abnormal; Notable for the following components:  ?    Result Value  ? Color, Urine YELLOW (*)   ? APPearance HAZY (*)   ? Leukocytes,Ua SMALL (*)   ? WBC, UA >50 (*)   ? All other components within normal limits  ?CHLAMYDIA/NGC RT PCR ([facility] ONLY)            ? ? ? ?PROCEDURES: ? ?Critical Care performed: No ? ?Procedures ? ? ?MEDICATIONS ORDERED IN ED: ?Medications  ?cefTRIAXone (ROCEPHIN) injection 500 mg (has no administration in time range)  ? ? ? ?IMPRESSION / MDM / ASSESSMENT AND PLAN / ED COURSE  ?I reviewed the triage vital signs and the nursing notes. ?             ?               ? ?36 y.o. male with no significant past medical history who presents to the ED with dysuria and penile discharge for the past 24 to 48 hours with generalized discomfort in his groin but no testicular swelling. ? ?Differential diagnosis includes, but is  not limited to, urethritis, UTI, epididymitis. ? ?Patient nontoxic-appearing and in no acute distress, examination of his groin is unremarkable with no visible lesions or testicular tenderness.  UA is concerning for infection, likely secondary to sexually transmitted disease.  We will send urine for culture as well as GC/chlamydia testing.  Patient is in agreement with empiric treatment with Rocephin and doxycycline.  He was counseled to follow-up with the health department and return to the ED for new or worsening symptoms.  Patient agrees with plan. ? ?  ? ? ?FINAL CLINICAL IMPRESSION(S) / ED DIAGNOSES  ? ?Final diagnoses:  ?Urethritis  ?Sexually transmitted disease  ? ? ? ?Rx / DC Orders  ? ?ED Discharge Orders   ? ?      Ordered  ?  doxycycline (VIBRAMYCIN) 100 MG capsule  2 times daily       ? 09/20/21 1857  ? ?  ?  ? ?  ? ? ? ?Note:  This document was prepared using Dragon voice recognition software and may include unintentional dictation errors. ?  ?Chesley Noon,  MD ?09/20/21 1906 ? ?

## 2021-09-22 LAB — URINE CULTURE: Culture: NO GROWTH

## 2021-09-24 ENCOUNTER — Ambulatory Visit: Payer: Self-pay

## 2024-02-18 ENCOUNTER — Other Ambulatory Visit: Payer: Self-pay

## 2024-02-18 ENCOUNTER — Emergency Department
Admission: EM | Admit: 2024-02-18 | Discharge: 2024-02-18 | Disposition: A | Discharge status: Home / Self Care | Attending: Emergency Medicine | Admitting: Emergency Medicine

## 2024-02-18 DIAGNOSIS — R059 Cough, unspecified: Secondary | ICD-10-CM | POA: Diagnosis present

## 2024-02-18 DIAGNOSIS — J029 Acute pharyngitis, unspecified: Secondary | ICD-10-CM | POA: Diagnosis not present

## 2024-02-18 LAB — RESP PANEL BY RT-PCR (RSV, FLU A&B, COVID)  RVPGX2
Influenza A by PCR: NEGATIVE
Influenza B by PCR: NEGATIVE
Resp Syncytial Virus by PCR: NEGATIVE
SARS Coronavirus 2 by RT PCR: NEGATIVE

## 2024-02-18 LAB — GROUP A STREP BY PCR: Group A Strep by PCR: NOT DETECTED

## 2024-02-18 MED ORDER — IBUPROFEN 800 MG PO TABS: 800.0000 mg | ORAL_TABLET | Freq: Three times a day (TID) | ORAL | 0 refills | Status: AC | PRN

## 2024-02-18 MED ORDER — IBUPROFEN 800 MG PO TABS
800.0000 mg | ORAL_TABLET | Freq: Once | ORAL | Status: AC
  Administered 2024-02-18: 800 mg via ORAL
  Filled 2024-02-18: qty 1

## 2024-02-18 MED ORDER — AMOXICILLIN 875 MG PO TABS: 875.0000 mg | ORAL_TABLET | Freq: Two times a day (BID) | ORAL | 0 refills | Status: AC

## 2024-02-18 NOTE — ED Provider Notes (Signed)
 [facility] Emergency Department Provider Note     Event Date/Time   First MD Initiated Contact with Patient 02/18/24 2128     (approximate)   History   Cough   HPI  Leonard Vance is a 38 y.o. male with no significant past medical history presents to the ED for evaluation of sore throat x 2 days.  Patient reports subjective fever at home.  He has taken multiple OTC cough medicine and cough drops.  Denies sick contacts.  Denies chest pain or shortness of breath.  No other complaint    Physical Exam   Triage Vital Signs: ED Triage Vitals  Encounter Vitals Group     BP 02/18/24 1903 (!) 154/97     Girls Systolic BP Percentile --      Girls Diastolic BP Percentile --      Boys Systolic BP Percentile --      Boys Diastolic BP Percentile --      Pulse Rate 02/18/24 1903 95     Resp 02/18/24 1903 20     Temp 02/18/24 1903 99 F (37.2 C)     Temp Source 02/18/24 2047 Oral     SpO2 02/18/24 1903 100 %     Weight 02/18/24 1903 220 lb (99.8 kg)     Height 02/18/24 1903 6' (1.829 m)     Head Circumference --      Peak Flow --      Pain Score 02/18/24 1903 10     Pain Loc --      Pain Education --      Exclude from Growth Chart --     Most recent vital signs: Vitals:   02/18/24 2047 02/18/24 2047  BP:  (!) 169/92  Pulse:  (!) 101  Resp:  18  Temp:  99.3 F (37.4 C)  SpO2: 100% 100%    General: Well appearing and comfortable. Alert and oriented. INAD.  Skin:  Warm, dry and intact. No rashes or lesions noted.     Head:  NCAT.  Eyes:  PERRLA. EOMI.  Ears:  EACs patent. Tympanic membranes clear bilaterally. No bulging, erythema or discharge.  Nose:   Mucosa is moist. No rhinorrhea. Throat: Oropharynx clear. No erythema or exudates. Tonsils not enlarged. Uvula is midline. CV:  Good peripheral perfusion. RRR.   RESP:  Normal effort. LCTAB.   ED Results / Procedures / Treatments   Labs (all labs ordered are listed, but only abnormal  results are displayed) Labs Reviewed  RESP PANEL BY RT-PCR (RSV, FLU A&B, COVID)  [id number]  GROUP A STREP BY PCR   No results found.  PROCEDURES:  Critical Care performed: No  Procedures   MEDICATIONS ORDERED IN ED: Medications  ibuprofen  (ADVIL ) tablet 800 mg (800 mg Oral Given 02/18/24 2122)     IMPRESSION / MDM / ASSESSMENT AND PLAN / ED COURSE  I reviewed the triage vital signs and the nursing notes.                               38 y.o. male presents to the emergency department for evaluation and treatment of sore throat. See HPI for further details.   Differential diagnosis includes, but is not limited to viral pharyngitis, tonsillitis, strep pharyngitis, viral URI  Patient's presentation is most consistent with acute complicated illness / injury requiring diagnostic workup.  Patient is alert and oriented.  He is  hemodynamic stable.  Physical exam findings are stated above and overall benign.  Respiratory panel and strep test negative.  Will send antibiotics to pharmacy.  Advised patient to wait and watch for 5 days and if symptoms do not improve to pick up the antibiotic.  Patient verbalized understanding.  Advised alternation between Tylenol  and ibuprofen  for fever.  He is in stable condition for discharge home.  ED return precaution discussed.    FINAL CLINICAL IMPRESSION(S) / ED DIAGNOSES   Final diagnoses:  Sore throat   Rx / DC Orders   ED Discharge Orders          Ordered    amoxicillin  (AMOXIL ) 875 MG tablet  2 times daily        02/18/24 2147    ibuprofen  (ADVIL ) 800 MG tablet  Every 8 hours PRN        02/18/24 2147    Ambulatory Referral to Primary Care (Establish Care)        02/18/24 2149             Note:  This document was prepared using Dragon voice recognition software and may include unintentional dictation errors.    Margrette, Lamaya Hyneman A, PA-C 02/18/24 2311    Viviann Pastor, MD 02/18/24 2325

## 2024-02-18 NOTE — ED Triage Notes (Signed)
 Pt repots cough congestion fever and sore throat x2 days

## 2024-02-18 NOTE — Discharge Instructions (Addendum)
 You were seen in the emergency department today for a cough. Your respiratory panel which includes COVID, influenza and RSV were negative.  Your rapid strep test is negative.  We have decided to go ahead and treat you with antibiotics.  Pain control:  Ibuprofen  (motrin /aleve/advil ) - You can take 3 tablets (600 mg) every 6 hours as needed for pain/fever.  Acetaminophen  (tylenol ) - You can take 2 extra strength tablets (1000 mg) every 6 hours as needed for pain/fever.  You can alternate these medications or take them together.  Make sure you eat food/drink water when taking these medications.    Stay hydrated by drinking plenty of fluids to thin mucus. Get adequate amount of sleep and avoid overexertion. Consider a humidifier at night. Warm teas and a spoonful of honey may help reduce cough frequency. Follow up with your primary care provider as needed.   For your sore throat Use throat lozenges or Chloraseptic spray.  Gargle with warm salt water several times daily
